# Patient Record
Sex: Female | Born: 1965 | Race: Black or African American | Hispanic: No | Marital: Single | Smoking: Never smoker
Health system: Southern US, Community
[De-identification: ages and names within clinical notes are randomized; demographics above are authoritative.]

---

## 2014-10-21 ENCOUNTER — Encounter (HOSPITAL_BASED_OUTPATIENT_CLINIC_OR_DEPARTMENT_OTHER): Payer: Self-pay | Admitting: Emergency Medicine

## 2014-10-21 ENCOUNTER — Emergency Department (HOSPITAL_BASED_OUTPATIENT_CLINIC_OR_DEPARTMENT_OTHER)
Admission: EM | Admit: 2014-10-21 | Discharge: 2014-10-22 | Disposition: A | Discharge status: Home / Self Care | Payer: 59 | Attending: Emergency Medicine | Admitting: Emergency Medicine

## 2014-10-21 DIAGNOSIS — K219 Gastro-esophageal reflux disease without esophagitis: Secondary | ICD-10-CM | POA: Insufficient documentation

## 2014-10-21 DIAGNOSIS — R9389 Abnormal findings on diagnostic imaging of other specified body structures: Secondary | ICD-10-CM

## 2014-10-21 DIAGNOSIS — Z3202 Encounter for pregnancy test, result negative: Secondary | ICD-10-CM | POA: Insufficient documentation

## 2014-10-21 DIAGNOSIS — R0602 Shortness of breath: Secondary | ICD-10-CM | POA: Diagnosis not present

## 2014-10-21 DIAGNOSIS — R918 Other nonspecific abnormal finding of lung field: Secondary | ICD-10-CM | POA: Insufficient documentation

## 2014-10-21 DIAGNOSIS — R079 Chest pain, unspecified: Secondary | ICD-10-CM

## 2014-10-21 LAB — CBC
HEMATOCRIT: 50 % — AB (ref 36.0–46.0)
HEMOGLOBIN: 17.2 g/dL — AB (ref 12.0–15.0)
MCH: 31.6 pg (ref 26.0–34.0)
MCHC: 34.4 g/dL (ref 30.0–36.0)
MCV: 91.7 fL (ref 78.0–100.0)
Platelets: 241 10*3/uL (ref 150–400)
RBC: 5.45 MIL/uL — ABNORMAL HIGH (ref 3.87–5.11)
RDW: 12.7 % (ref 11.5–15.5)
WBC: 6.8 10*3/uL (ref 4.0–10.5)

## 2014-10-21 NOTE — ED Notes (Signed)
Patient states that after lunch today she started to have burning to her chest down into her epigastric region. The patient also has had N/V. Reports that she also has a hard time to catch her breath.

## 2014-10-21 NOTE — ED Notes (Signed)
C/ogeneralized cp radiating into abd x 2 hours w n/v, sob

## 2014-10-22 ENCOUNTER — Emergency Department (HOSPITAL_BASED_OUTPATIENT_CLINIC_OR_DEPARTMENT_OTHER): Payer: 59

## 2014-10-22 LAB — RAPID HIV SCREEN (WH-MAU): Rapid HIV Screen: NONREACTIVE

## 2014-10-22 LAB — BASIC METABOLIC PANEL
Anion gap: 16 — ABNORMAL HIGH (ref 5–15)
BUN: 9 mg/dL (ref 6–23)
CALCIUM: 9.5 mg/dL (ref 8.4–10.5)
CO2: 23 meq/L (ref 19–32)
CREATININE: 0.8 mg/dL (ref 0.50–1.10)
Chloride: 100 mEq/L (ref 96–112)
GFR calc Af Amer: >90 mL/min (ref >90)
GFR calc non Af Amer: 87 mL/min — ABNORMAL LOW (ref >90)
GLUCOSE: 146 mg/dL — AB (ref 70–99)
Potassium: 4 mEq/L (ref 3.7–5.3)
Sodium: 139 mEq/L (ref 137–147)

## 2014-10-22 LAB — DIFFERENTIAL
BASOS ABS: 0 10*3/uL (ref 0.0–0.1)
Basophils Relative: 0 % (ref 0–1)
Eosinophils Absolute: 0.1 10*3/uL (ref 0.0–0.7)
Eosinophils Relative: 2 % (ref 0–5)
LYMPHS PCT: 10 % — AB (ref 12–46)
Lymphs Abs: 0.7 10*3/uL (ref 0.7–4.0)
MONO ABS: 0.5 10*3/uL (ref 0.1–1.0)
MONOS PCT: 7 % (ref 3–12)
NEUTROS ABS: 5.5 10*3/uL (ref 1.7–7.7)
Neutrophils Relative %: 81 % — ABNORMAL HIGH (ref 43–77)

## 2014-10-22 LAB — URINALYSIS, ROUTINE W REFLEX MICROSCOPIC
Bilirubin Urine: NEGATIVE
Glucose, UA: NEGATIVE mg/dL
Ketones, ur: NEGATIVE mg/dL
Leukocytes, UA: NEGATIVE
NITRITE: NEGATIVE
Protein, ur: NEGATIVE mg/dL
SPECIFIC GRAVITY, URINE: 1.013 (ref 1.005–1.030)
Urobilinogen, UA: 1 mg/dL (ref 0.0–1.0)
pH: 7 (ref 5.0–8.0)

## 2014-10-22 LAB — PREGNANCY, URINE: PREG TEST UR: NEGATIVE

## 2014-10-22 LAB — URINE MICROSCOPIC-ADD ON

## 2014-10-22 LAB — TROPONIN I

## 2014-10-22 MED ORDER — SUCRALFATE 1 G PO TABS: 1.0000 g | ORAL_TABLET | Freq: Three times a day (TID) | ORAL | Status: AC

## 2014-10-22 MED ORDER — SODIUM CHLORIDE 0.9 % IV BOLUS (SEPSIS)
1000.0000 mL | Freq: Once | INTRAVENOUS | Status: AC
  Administered 2014-10-22: 1000 mL via INTRAVENOUS

## 2014-10-22 MED ORDER — FENTANYL CITRATE 0.05 MG/ML IJ SOLN
100.0000 ug | Freq: Once | INTRAMUSCULAR | Status: AC
  Administered 2014-10-22: 100 ug via INTRAVENOUS
  Filled 2014-10-22: qty 2

## 2014-10-22 MED ORDER — SUCRALFATE 1 G PO TABS
1.0000 g | ORAL_TABLET | Freq: Once | ORAL | Status: AC
  Administered 2014-10-22: 1 g via ORAL
  Filled 2014-10-22: qty 1

## 2014-10-22 MED ORDER — ONDANSETRON HCL 4 MG/2ML IJ SOLN
4.0000 mg | Freq: Once | INTRAMUSCULAR | Status: AC
  Administered 2014-10-22: 4 mg via INTRAVENOUS
  Filled 2014-10-22: qty 2

## 2014-10-22 MED ORDER — PANTOPRAZOLE SODIUM 40 MG IV SOLR
40.0000 mg | Freq: Once | INTRAVENOUS | Status: AC
  Administered 2014-10-22: 40 mg via INTRAVENOUS
  Filled 2014-10-22: qty 40

## 2014-10-22 MED ORDER — OMEPRAZOLE 40 MG PO CPDR: DELAYED_RELEASE_CAPSULE | ORAL | Status: AC

## 2014-10-22 NOTE — ED Provider Notes (Signed)
CSN: [id number]     Arrival date & time 10/21/14  2308 History   First MD Initiated Contact with Patient 10/22/14 0122     Chief Complaint  Patient presents with  . Chest Pain     (Consider location/radiation/quality/duration/timing/severity/associated sxs/prior Treatment) HPI  This is a 48 year old female who developed substernal burning about 3 PM yesterday afternoon. She had a stromboli for lunch around noon. The pain radiates to her epigastrium. It is not worse with movement or palpation. She has had associated nausea and vomiting that began about 7 PM yesterday. She has had increased bowel movements but they are not loose or liquid. She rates the pain is moderate to severe. There is some mild associated shortness of breath.  History reviewed. No pertinent past medical history. History reviewed. No pertinent past surgical history. No family history on file. History  Substance Use Topics  . Smoking status: Never Smoker   . Smokeless tobacco: Not on file  . Alcohol Use: No   OB History    No data available     Review of Systems  All other systems reviewed and are negative.   Allergies  Review of patient's allergies indicates no known allergies.  Home Medications   Prior to Admission medications   Not on File   BP 156/113 mmHg  Pulse 73  Temp(Src) 98.1 F (36.7 C) (Oral)  Resp 16  SpO2 100%  LMP 10/03/2014   Physical Exam  General: Well-developed, well-nourished female in no acute distress; appearance consistent with age of record HENT: normocephalic; atraumatic Eyes: pupils equal, round and reactive to light; extraocular muscles intact Neck: supple Heart: regular rate and rhythm Lungs: clear to auscultation bilaterally Abdomen: soft; nondistended; mild epigastric tenderness; no masses or hepatosplenomegaly; bowel sounds present Extremities: No deformity; full range of motion; pulses normal Neurologic: Awake, alert and oriented; motor function intact in all  extremities and symmetric; no facial droop Skin: Warm and dry Psychiatric: Flat affect   ED Course  Procedures (including critical care time)   MDM    EKG Interpretation  Date/Time:  Friday October 21 2014 23:15:09 EST Ventricular Rate:  67 PR Interval:  146 QRS Duration: 84 QT Interval:  430 QTC Calculation: 454 R Axis:   49 Text Interpretation:  Normal sinus rhythm with sinus arrhythmia Normal ECG No previous ECGs available Confirmed by Kaylah Chiasson  MD, Jonny Ruiz ([id number]) on 10/22/2014 1:22:38 AM      Nursing notes and vitals signs, including pulse oximetry, reviewed.  Summary of this visit's results, reviewed by myself:  Labs:  Results for orders placed or performed during the hospital encounter of 10/21/14 (from the past 24 hour(s))  CBC     Status: Abnormal   Collection Time: 10/21/14 11:40 PM  Result Value Ref Range   WBC 6.8 4.0 - 10.5 K/uL   RBC 5.45 (H) 3.87 - 5.11 MIL/uL   Hemoglobin 17.2 (H) 12.0 - 15.0 g/dL   HCT 19.1 (H) 47.8 - 29.5 %   MCV 91.7 78.0 - 100.0 fL   MCH 31.6 26.0 - 34.0 pg   MCHC 34.4 30.0 - 36.0 g/dL   RDW 62.1 30.8 - 65.7 %   Platelets 241 150 - 400 K/uL  Basic metabolic panel     Status: Abnormal   Collection Time: 10/21/14 11:40 PM  Result Value Ref Range   Sodium 139 137 - 147 mEq/L   Potassium 4.0 3.7 - 5.3 mEq/L   Chloride 100 96 - 112 mEq/L   CO2 23  19 - 32 mEq/L   Glucose, Bld 146 (H) 70 - 99 mg/dL   BUN 9 6 - 23 mg/dL   Creatinine, Ser 7.820.80 0.50 - 1.10 mg/dL   Calcium 9.5 8.4 - 95.610.5 mg/dL   GFR calc non Af Amer 87 (L) >90 mL/min   GFR calc Af Amer >90 >90 mL/min   Anion gap 16 (H) 5 - 15  Troponin I ([facility])     Status: None   Collection Time: 10/21/14 11:40 PM  Result Value Ref Range   Troponin I <0.30 <0.30 ng/mL  Rapid HIV screen     Status: None   Collection Time: 10/21/14 11:40 PM  Result Value Ref Range   SUDS Rapid HIV Screen NON REACTIVE NON REACTIVE  Differential     Status: Abnormal   Collection Time: 10/21/14 11:40 PM   Result Value Ref Range   Neutrophils Relative % 81 (H) 43 - 77 %   Neutro Abs 5.5 1.7 - 7.7 K/uL   Lymphocytes Relative 10 (L) 12 - 46 %   Lymphs Abs 0.7 0.7 - 4.0 K/uL   Monocytes Relative 7 3 - 12 %   Monocytes Absolute 0.5 0.1 - 1.0 K/uL   Eosinophils Relative 2 0 - 5 %   Eosinophils Absolute 0.1 0.0 - 0.7 K/uL   Basophils Relative 0 0 - 1 %   Basophils Absolute 0.0 0.0 - 0.1 K/uL  Urinalysis, Routine w reflex microscopic     Status: Abnormal   Collection Time: 10/22/14 12:01 AM  Result Value Ref Range   Color, Urine YELLOW YELLOW   APPearance CLOUDY (A) CLEAR   Specific Gravity, Urine 1.013 1.005 - 1.030   pH 7.0 5.0 - 8.0   Glucose, UA NEGATIVE NEGATIVE mg/dL   Hgb urine dipstick TRACE (A) NEGATIVE   Bilirubin Urine NEGATIVE NEGATIVE   Ketones, ur NEGATIVE NEGATIVE mg/dL   Protein, ur NEGATIVE NEGATIVE mg/dL   Urobilinogen, UA 1.0 0.0 - 1.0 mg/dL   Nitrite NEGATIVE NEGATIVE   Leukocytes, UA NEGATIVE NEGATIVE  Pregnancy, urine     Status: None   Collection Time: 10/22/14 12:01 AM  Result Value Ref Range   Preg Test, Ur NEGATIVE NEGATIVE  Urine microscopic-add on     Status: Abnormal   Collection Time: 10/22/14 12:01 AM  Result Value Ref Range   Squamous Epithelial / LPF MANY (A) RARE   WBC, UA 0-2 <3 WBC/hpf   RBC / HPF 0-2 <3 RBC/hpf   Bacteria, UA MANY (A) RARE   Urine-Other AMORPHOUS URATES/PHOSPHATES     Imaging Studies: Dg Chest 2 View  10/22/2014   CLINICAL DATA:  Acute onset of shortness of breath and chest pain and burning, extending to the abdomen. Initial encounter.  EXAM: CHEST  2 VIEW  COMPARISON:  None.  FINDINGS: The lungs are well-aerated. Diffuse patchy bilateral airspace opacities raise concern for pneumonia. There is no evidence of pleural effusion or pneumothorax.  The heart is normal in size; the mediastinal contour is within normal limits. No acute osseous abnormalities are seen.  IMPRESSION: Diffuse patchy bilateral airspace opacities raise  concern for pneumonia. However, several less common entities could have a similar appearance. If the patient does not respond to treatment for pneumonia, additional imaging could be performed.   Electronically Signed   By: Roanna RaiderJeffery  Chang M.D.   On: 10/22/2014 01:26   Ct Chest Wo Contrast  10/22/2014   CLINICAL DATA:  Acute onset of burning in chest, extending to the epigastric  region. Vomiting. Abnormal chest radiograph. Initial encounter.  EXAM: CT CHEST WITHOUT CONTRAST  TECHNIQUE: Multidetector CT imaging of the chest was performed following the standard protocol without IV contrast.  COMPARISON:  Chest radiograph performed earlier today at 12:38 a.m.  FINDINGS: Scattered spiculated nodular densities are noted in both lungs, with subpleural involvement and mild associated bronchiectasis at the lower lung lobes. Minimal associated parenchymal calcifications are seen. The appearance is suspicious for pulmonary sarcoidosis, given the lack of significant pulmonary symptoms at this time. Alternatively, this could reflect remote sequelae of prior severe coccidioidomycosis or other infectious process.  Scattered calcified mediastinal and hilar nodes are noted, without significant mediastinal lymphadenopathy. No pericardial effusion is identified. The mediastinum is otherwise unremarkable in appearance. The great vessels are within normal limits. The thyroid gland is unremarkable. No axillary lymphadenopathy is seen.  The visualized portions of the liver and spleen are unremarkable in appearance. The distal esophagus is grossly unremarkable in appearance.  No acute osseous abnormalities are identified.  IMPRESSION: 1. Diffuse spiculated nodular densities within both lungs, with subpleural involvement and mild associated bronchiectasis. Minimal associated parenchymal calcifications seen. Findings are suspicious for pulmonary sarcoidosis, especially given the lack of significant pulmonary symptoms. Alternately, this  could reflect remote sequelae of prior severe coccidioidomycosis or other infectious process. 2. Scattered calcified mediastinal and hilar nodes noted, without significant mediastinal lymphadenopathy.   Electronically Signed   By: Roanna RaiderJeffery  Chang M.D.   On: 10/22/2014 03:06   4:52 AM The patient was advised of her CT findings. She states that she was told many years ago that she may be developing sarcoidosis but this was never followed up on. She was advised to contact her primary care physician [place] she is here for [place].  The patient's chest and the gastric pain are consistent with GERD/gastritis and we will treat appropriately.     Hanley SeamenJohn L Yarel Kilcrease, MD 10/22/14 [phone]

## 2014-10-22 NOTE — ED Notes (Signed)
Patient transported to CT 

## 2016-08-09 IMAGING — CT CT CHEST W/O CM
2 of 4 series · 15 of 36 positions shown, 18 images · non-contrast
Comparison: Chest radiograph performed earlier today at [DATE] a.m.

CLINICAL DATA: Acute onset of burning in chest, extending to the
epigastric region. Vomiting. Abnormal chest radiograph. Initial
encounter.

EXAM:
CT CHEST WITHOUT CONTRAST
TECHNIQUE: Multidetector CT imaging of the chest was performed following the
standard protocol without IV contrast..

[Series 3: chest 5.0 b31f · axial · 0.64mm/px · z∈[-274,-49]mm · 12 of 51 slices shown, 15 images]
[im 3/51  mediastinal]
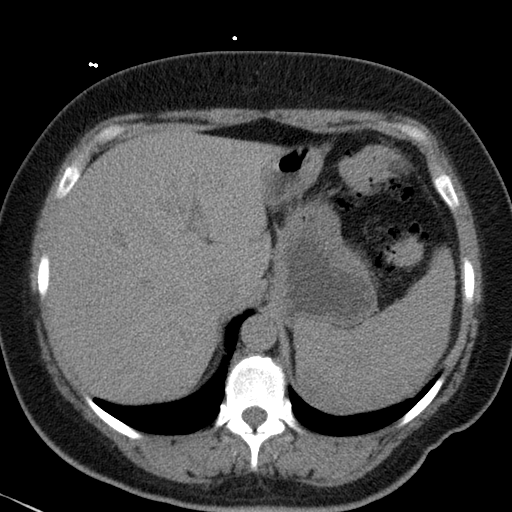
[im 3/51  lung]
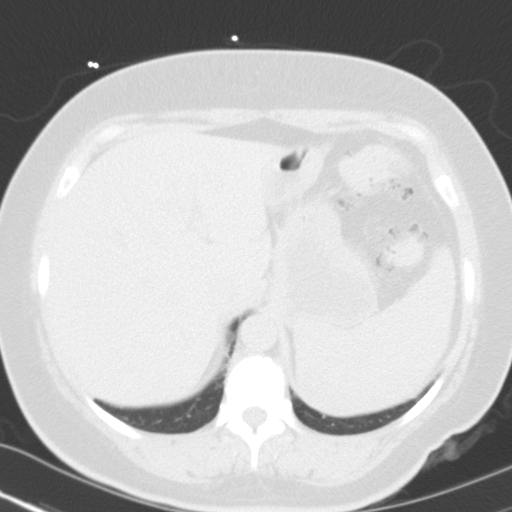
[im 7/51  lung]
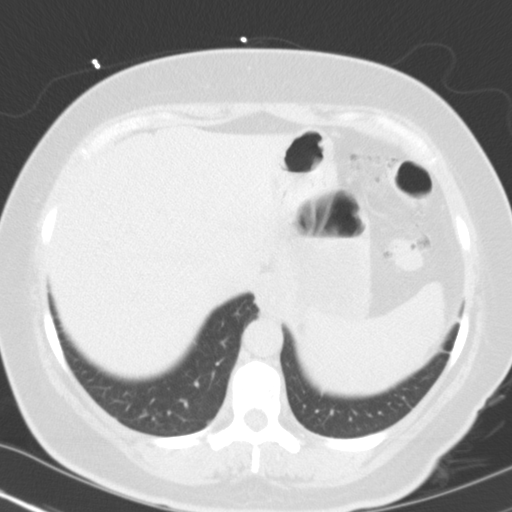
[im 12/51  lung]
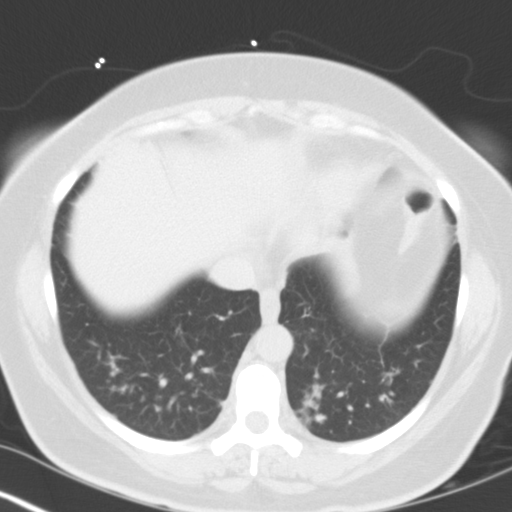
[im 16/51  lung]
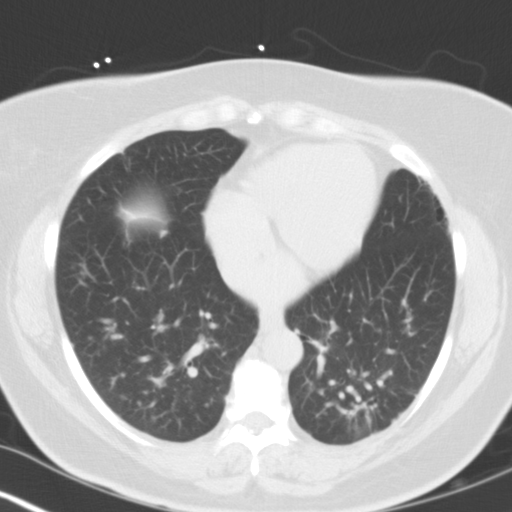
[im 19/51  mediastinal]
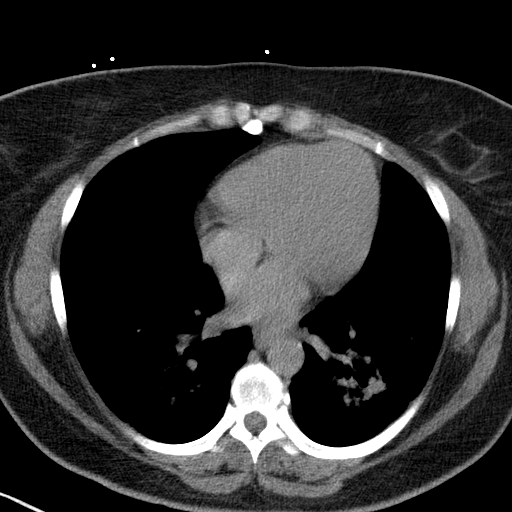
[im 19/51  lung]
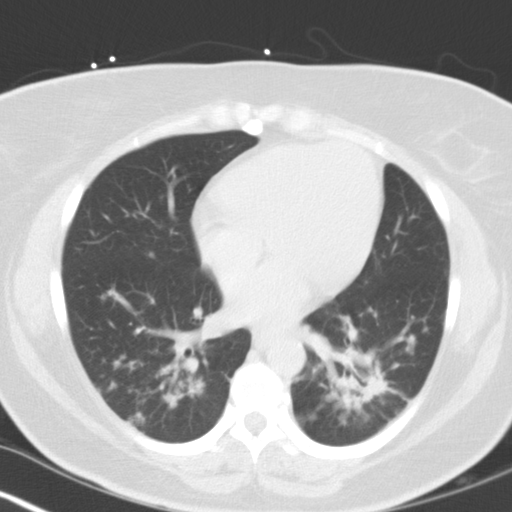
[im 23/51  lung]
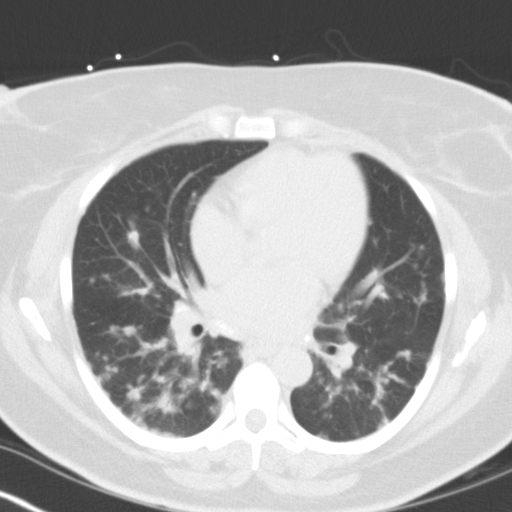
[im 28/51  lung]
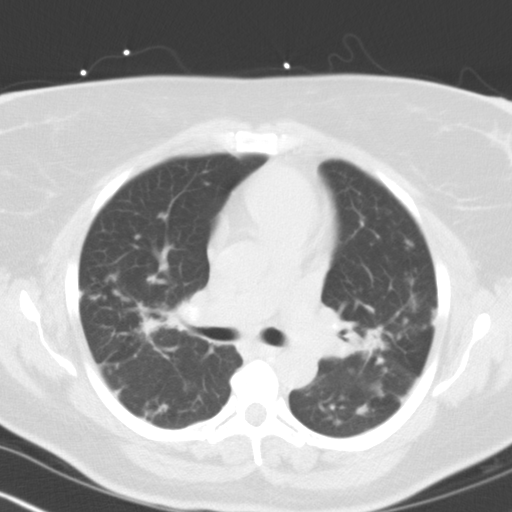
[im 32/51  lung]
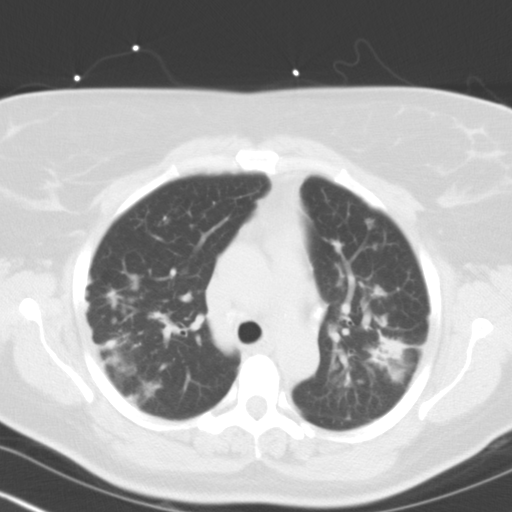
[im 35/51  mediastinal]
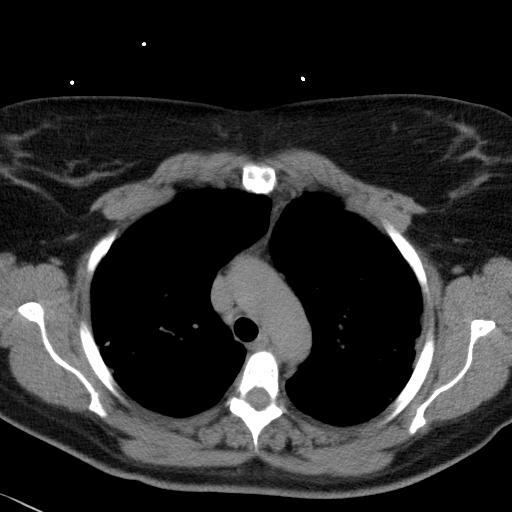
[im 35/51  lung]
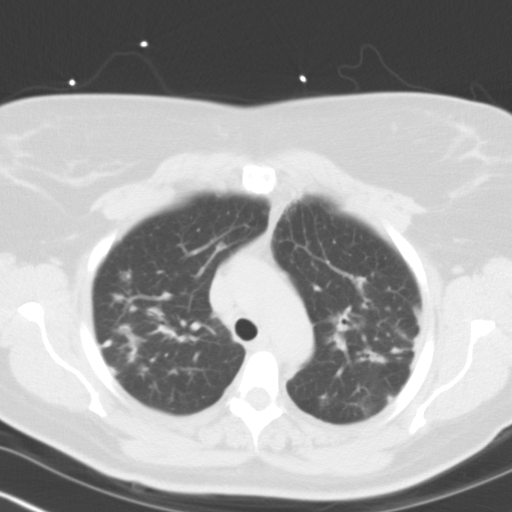
[im 39/51  lung]
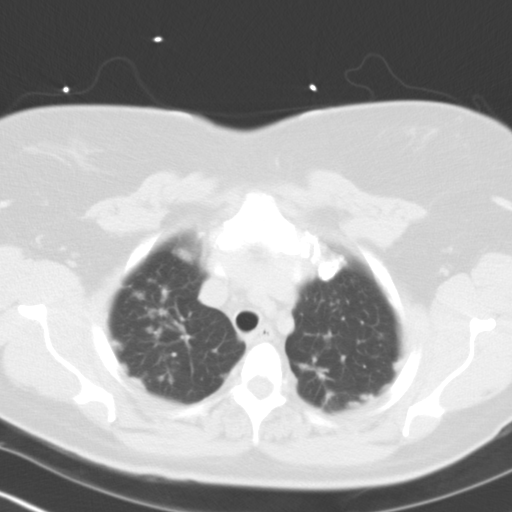
[im 44/51  lung]
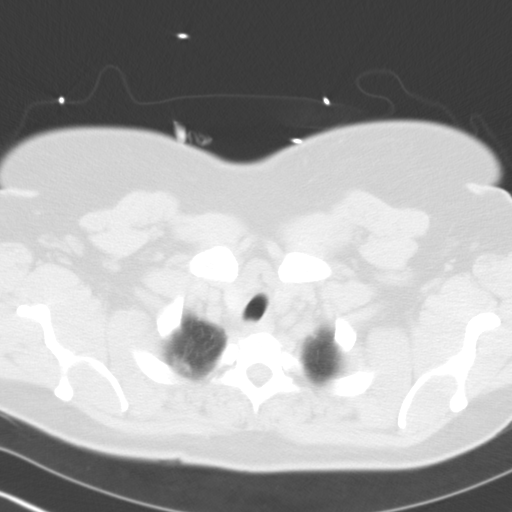
[im 48/51  lung]
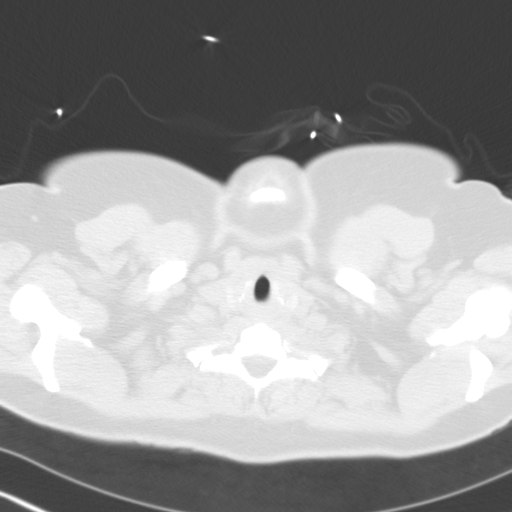

[Series 7: chest 3.0 coronal · coronal · 0.59mm/px · 3 of 97 slices shown]
[im 20/97  lung]
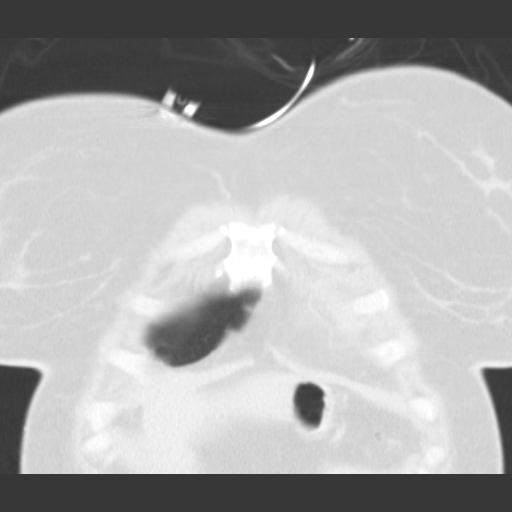
[im 39/97  lung]
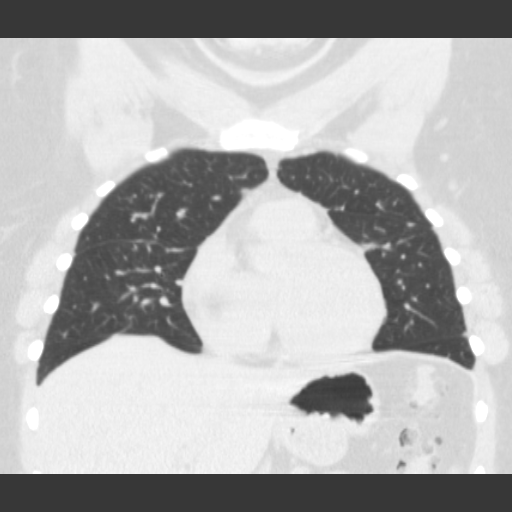
[im 58/97  lung]
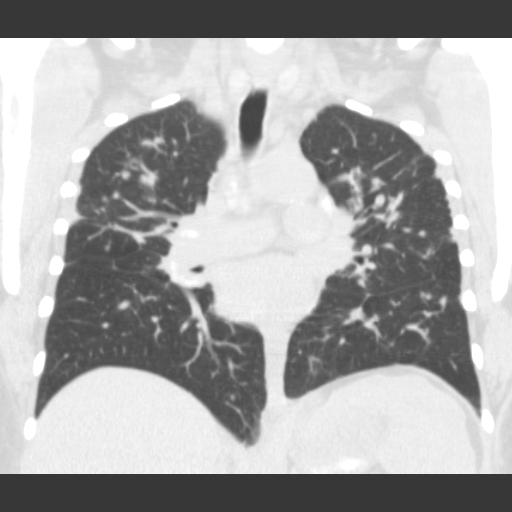

[15 of 36 positions shown; findings below may reference images not displayed]

FINDINGS: Scattered spiculated nodular densities are noted in both lungs, with
subpleural involvement and mild associated bronchiectasis at the
lower lung lobes. Minimal associated parenchymal calcifications are
seen. The appearance is suspicious for pulmonary sarcoidosis, given
the lack of significant pulmonary symptoms at this time.
Alternatively, this could reflect remote sequelae of prior severe
coccidioidomycosis or other infectious process.

Scattered calcified mediastinal and hilar nodes are noted, without
significant mediastinal lymphadenopathy. No pericardial effusion is
identified. The mediastinum is otherwise unremarkable in appearance.
The great vessels are within normal limits. The thyroid gland is
unremarkable. No axillary lymphadenopathy is seen.

The visualized portions of the liver and spleen are unremarkable in
appearance. The distal esophagus is grossly unremarkable in
appearance.

No acute osseous abnormalities are identified.
IMPRESSION: 1. Diffuse spiculated nodular densities within both lungs, with
subpleural involvement and mild associated bronchiectasis. Minimal
associated parenchymal calcifications seen. Findings are suspicious
for pulmonary sarcoidosis, especially given the lack of significant
pulmonary symptoms. Alternately, this could reflect remote sequelae
of prior severe coccidioidomycosis or other infectious process.
2. Scattered calcified mediastinal and hilar nodes noted, without
significant mediastinal lymphadenopathy.
